# Patient Record
Sex: Female | Born: 1960 | Race: White | Hispanic: No | Marital: Married | Smoking: Former smoker
Health system: Southern US, Community
[De-identification: ages and names within clinical notes are randomized; demographics above are authoritative.]

## PROBLEM LIST (undated history)

## (undated) HISTORY — PX: ABDOMINAL HYSTERECTOMY: SHX81

---

## 2009-02-15 ENCOUNTER — Ambulatory Visit: Payer: Self-pay | Admitting: Occupational Medicine

## 2009-02-15 DIAGNOSIS — S93409A Sprain of unspecified ligament of unspecified ankle, initial encounter: Secondary | ICD-10-CM | POA: Insufficient documentation

## 2010-05-23 IMAGING — CR DG ANKLE COMPLETE 3+V*R*
3 series · 3 of 3 positions shown · non-contrast
Comparison: None.

CLINICAL DATA: Fall, right ankle pain

RIGHT ANKLE - COMPLETE 3+ VIEW

[view not recorded (1 of 3)]
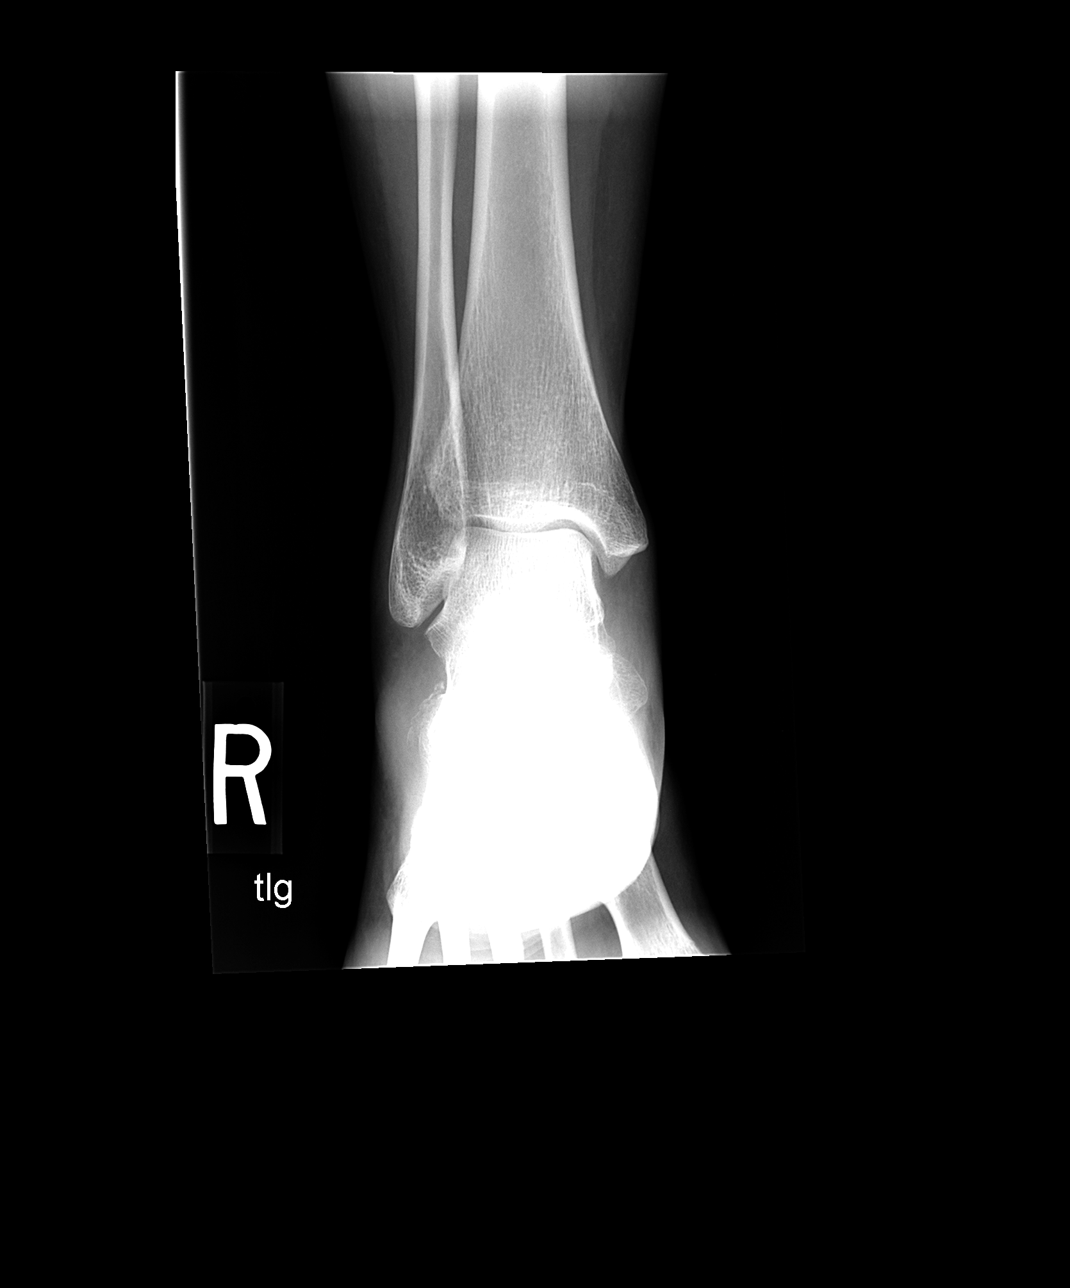

[view not recorded (2 of 3)]
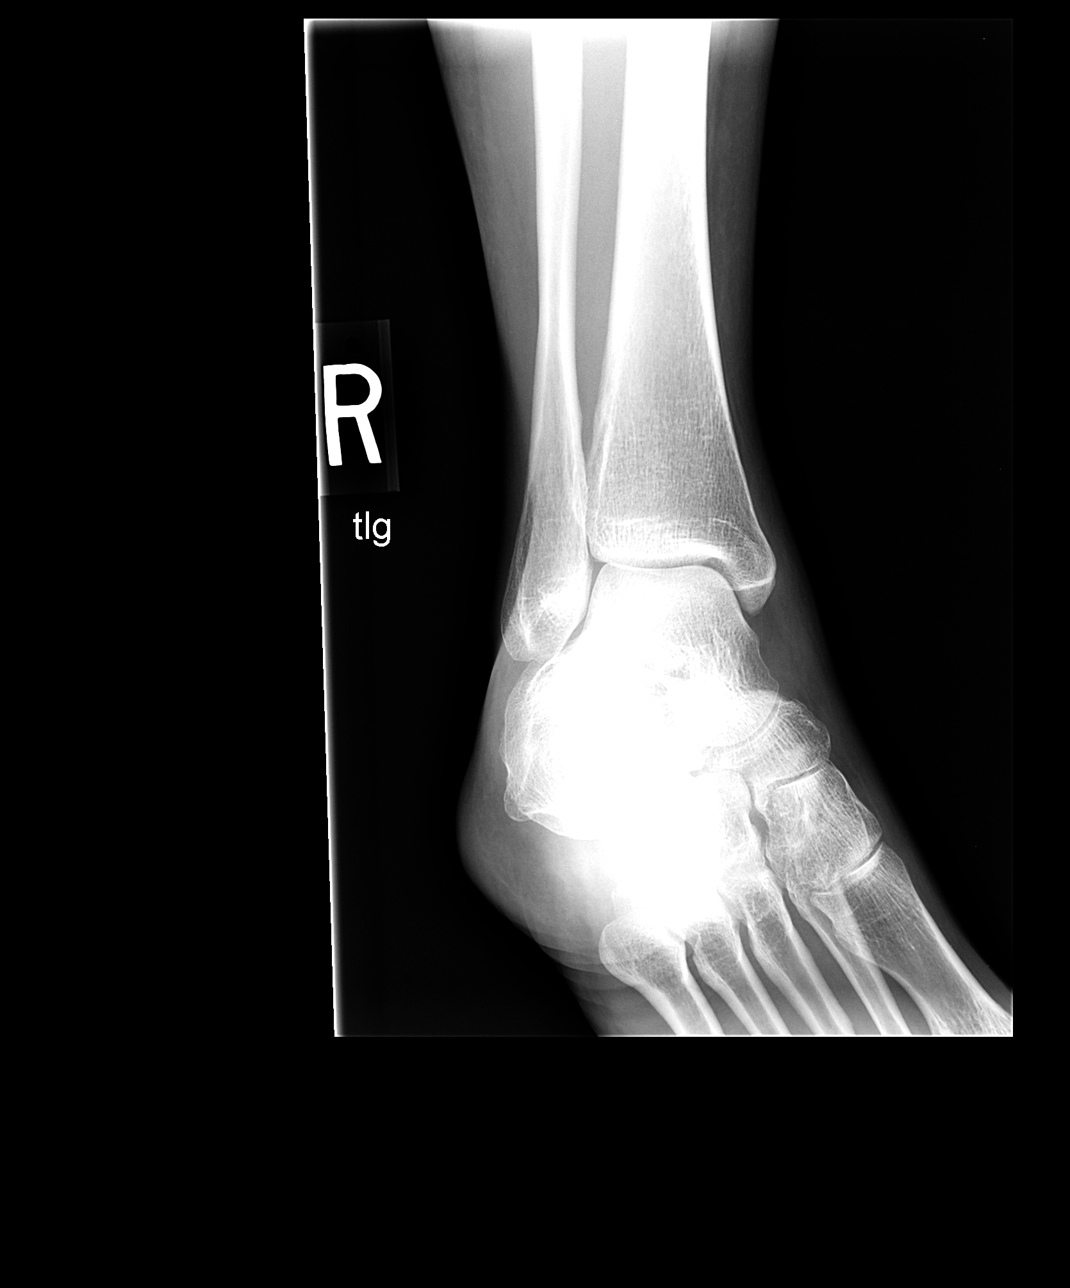

[view not recorded (3 of 3)]
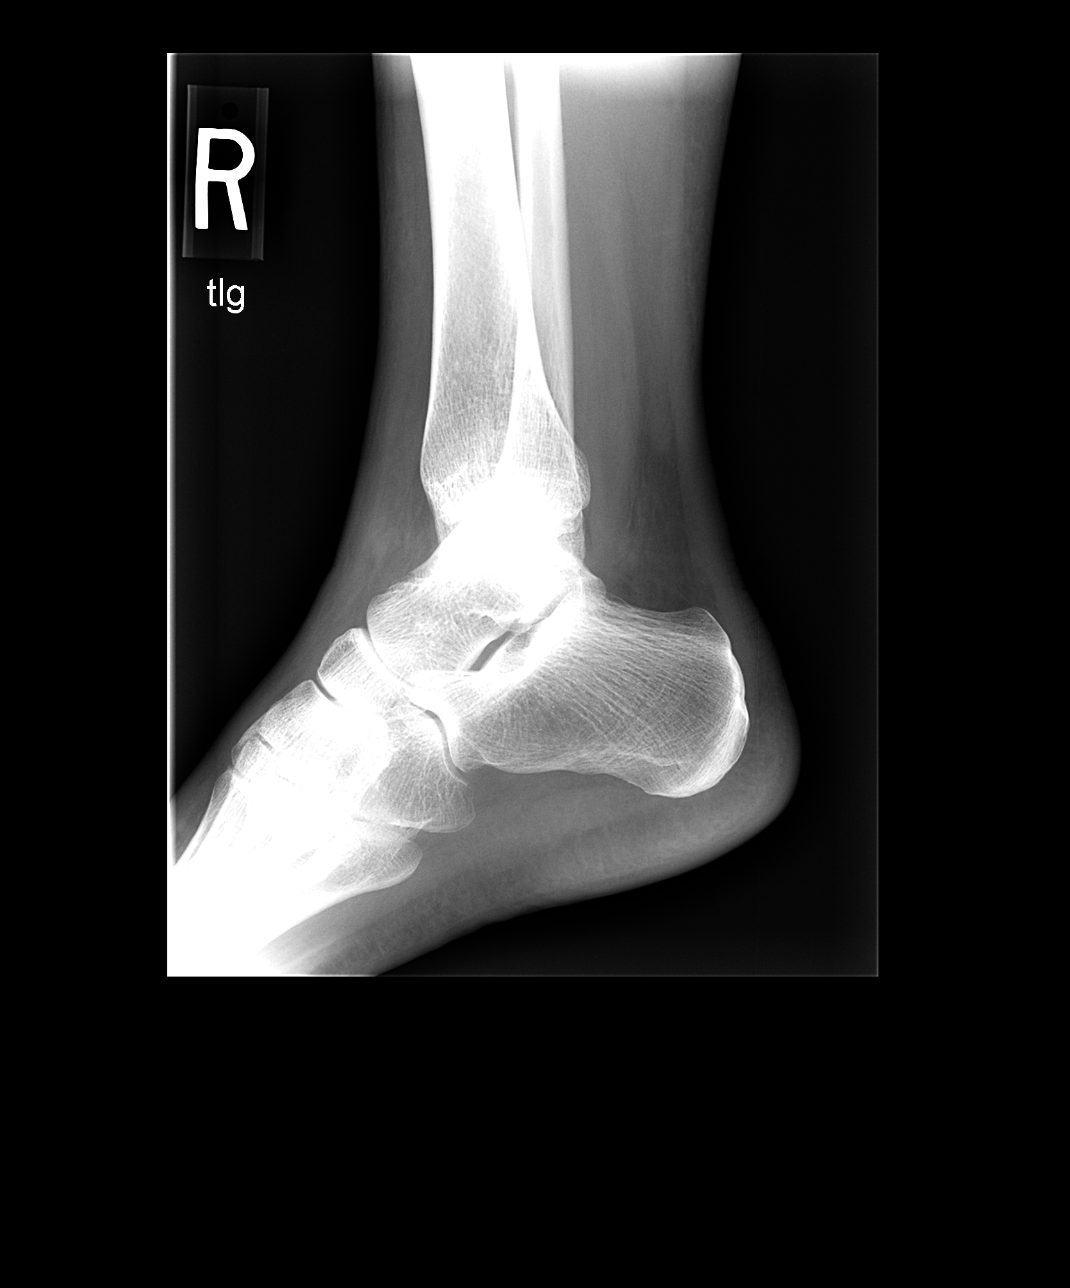

[3 of 3 positions shown; findings below may reference images not displayed]

FINDINGS: Normal alignment.  Mild soft tissue swelling laterally.
The frontal view demonstrates bony irregularity along the lateral
margin of the calcaneus and cuboid region.  A small avulsion
fracture in this region is not entirely excluded.  This is not
confirmed on the oblique and lateral views.  Recommend correlation
with clinical exam for focal point tenderness in this region.
Ankle alignment is anatomic.  Intact malleoli and talus.
IMPRESSION: Possible small avulsion fracture laterally along the calcaneus and
cuboid region as described.

## 2013-06-03 ENCOUNTER — Emergency Department (INDEPENDENT_AMBULATORY_CARE_PROVIDER_SITE_OTHER)
Admission: EM | Admit: 2013-06-03 | Discharge: 2013-06-03 | Disposition: A | Discharge status: Home / Self Care | Payer: BC Managed Care – PPO | Source: Home / Self Care | Attending: Family Medicine | Admitting: Family Medicine

## 2013-06-03 ENCOUNTER — Encounter: Payer: Self-pay | Admitting: *Deleted

## 2013-06-03 DIAGNOSIS — J069 Acute upper respiratory infection, unspecified: Secondary | ICD-10-CM

## 2013-06-03 DIAGNOSIS — J029 Acute pharyngitis, unspecified: Secondary | ICD-10-CM

## 2013-06-03 LAB — POCT RAPID STREP A (OFFICE): Rapid Strep A Screen: NEGATIVE

## 2013-06-03 MED ORDER — BENZONATATE 100 MG PO CAPS: ORAL_CAPSULE | ORAL | Status: DC

## 2013-06-03 MED ORDER — AZITHROMYCIN 250 MG PO TABS: ORAL_TABLET | ORAL | Status: DC

## 2013-06-03 NOTE — ED Provider Notes (Signed)
CSN: [id number]     Arrival date & time 06/03/13  1135 History   First MD Initiated Contact with Patient 06/03/13 1158     Chief Complaint  Patient presents with  . Sore Throat      HPI Comments: Patient developed a sore throat about 6 days ago that has persisted.  She remains fatigued and has now developed sinus congestion and post-nasal drainage.  She is also beginning to cough  The history is provided by the patient.    History reviewed. No pertinent past medical history. Past Surgical History  Procedure Laterality Date  . Abdominal hysterectomy      partial   Family History  Problem Relation Age of Onset  . Cancer Mother     breast   History  Substance Use Topics  . Smoking status: Former Smoker    Quit date: 06/03/1992  . Smokeless tobacco: Never Used  . Alcohol Use: Yes   OB History   Grav Para Term Preterm Abortions TAB SAB Ect Mult Living                 Review of Systems + sore throat + cough No pleuritic pain No wheezing + nasal congestion + post-nasal drainage No sinus pain/pressure No itchy/red eyes No earache No hemoptysis No SOB No fever, + chills No nausea No vomiting No abdominal pain No diarrhea No urinary symptoms No skin rashes + fatigue No myalgias No headache Used OTC meds without relief  Allergies  Penicillins  Home Medications   Current Outpatient Rx  Name  Route  Sig  Dispense  Refill  . ESTROGENS CONJ SYNTHETIC A PO   Oral   Take by mouth.         Marland Kitchen omeprazole (PRILOSEC) 10 MG capsule   Oral   Take 10 mg by mouth daily.         Marland Kitchen azithromycin (ZITHROMAX Z-PAK) 250 MG tablet      Take 2 tabs today; then begin one tab once daily for 4 more days. (Rx void after 06/11/13)   6 each   0   . benzonatate (TESSALON) 100 MG capsule      Take one cap at bedtime as necessary for cough   12 capsule   0    BP 132/86  Pulse 93  Temp(Src) 98.2 F (36.8 C) (Oral)  Resp 14  Ht 5\' 6"  (1.676 m)  Wt 145 lb (65.772 kg)   BMI 23.41 kg/m2  SpO2 99% Physical Exam Nursing notes and Vital Signs reviewed. Appearance:  Patient appears healthy, stated age, and in no acute distress Eyes:  Pupils are equal, round, and reactive to light and accomodation.  Extraocular movement is intact.  Conjunctivae are not inflamed  Ears:  Canals normal.  Tympanic membranes normal.  Nose:  Mildly congested turbinates.  No sinus tenderness.   Pharynx:   Minimal erythema Neck:  Supple.  Non- tender shotty anterior/posterior nodes are palpated bilaterally  Lungs:  Clear to auscultation.  Breath sounds are equal.  Heart:  Regular rate and rhythm without murmurs, rubs, or gallops.  Abdomen:  Nontender without masses or hepatosplenomegaly.  Bowel sounds are present.  No CVA or flank tenderness.  Extremities:  No edema.  No calf tenderness Skin:  No rash present.   ED Course  Procedures  none    Labs Reviewed  STREP A DNA PROBE  POCT RAPID STREP A (OFFICE) negative       MDM   1. Acute  pharyngitis   2. Acute upper respiratory infections of unspecified site; suspect viral URI    Throat culture pending. There is no evidence of bacterial infection today.  Treat symptomatically for now: Prescription written for Benzonatate [facility]) to take at bedtime for night-time cough.  Take plain Mucinex (guaifenesin) twice daily for cough and congestion.  May add Sudafed for sinus congestion.  Increase fluid intake, rest. May use Afrin nasal spray (or generic oxymetazoline) twice daily for about 5 days.  Also recommend using saline nasal spray several times daily and saline nasal irrigation (AYR is a common brand) Stop all antihistamines for now, and other non-prescription cough/cold preparations. May take Ibuprofen 200mg , 4 tabs every 8 hours with food for sore throat. Try warm salt water gargles. Begin Azithromycin if not improving about one week or if persistent fever develops (Given a prescription to hold, with an expiration date)   Follow-up with family doctor if not improving about10 days.     Lattie Haw, MD 06/03/13 1250

## 2013-06-03 NOTE — ED Notes (Signed)
Teran c/o sore throat, non-productve cough and body aches x 5 days. Taken Sudafed, Mucinex and Claritin otc.

## 2013-06-05 ENCOUNTER — Telehealth: Payer: Self-pay | Admitting: *Deleted

## 2013-10-30 ENCOUNTER — Emergency Department (INDEPENDENT_AMBULATORY_CARE_PROVIDER_SITE_OTHER)
Admission: EM | Admit: 2013-10-30 | Discharge: 2013-10-30 | Disposition: A | Discharge status: Home / Self Care | Payer: BC Managed Care – PPO | Source: Home / Self Care | Attending: Emergency Medicine | Admitting: Emergency Medicine

## 2013-10-30 ENCOUNTER — Encounter: Payer: Self-pay | Admitting: Emergency Medicine

## 2013-10-30 DIAGNOSIS — J0100 Acute maxillary sinusitis, unspecified: Secondary | ICD-10-CM

## 2013-10-30 MED ORDER — PROMETHAZINE-CODEINE 6.25-10 MG/5ML PO SYRP: ORAL_SOLUTION | ORAL | Status: AC

## 2013-10-30 MED ORDER — AZITHROMYCIN 250 MG PO TABS: ORAL_TABLET | ORAL | Status: AC

## 2013-10-30 NOTE — ED Notes (Signed)
Chloe Stewart c/o sinus pain, congestion, HA and cough with minimal sputum x 3 days. Denies fever. No flu vac this season.

## 2013-10-30 NOTE — ED Provider Notes (Signed)
CSN: [id number]     Arrival date & time 10/30/13  1029 History   First MD Initiated Contact with Patient 10/30/13 1031     Chief Complaint  Patient presents with  . Sinus Problem  . Cough   (Consider location/radiation/quality/duration/timing/severity/associated sxs/prior Treatment) HPI SINUSITIS  Onset: 3-4 days Facial/sinus pressure with discolored nasal mucus.    Severity: moderate Tried OTC meds without significant relief.  Symptoms:  + Fever  + URI prodrome with nasal congestion + Minimal swollen neck glands + mild Sinus Headache + mild ear pressure  No Allergy symptoms No significant Sore Throat No eye symptoms     No significant Cough No chest pain No shortness of breath  No wheezing  No Abdominal Pain No Nausea No Vomiting No diarrhea  No Myalgias No focal neurologic symptoms No syncope No Rash  No Urinary symptoms          History reviewed. No pertinent past medical history. Past Surgical History  Procedure Laterality Date  . Abdominal hysterectomy      partial   Family History  Problem Relation Age of Onset  . Cancer Mother     breast   History  Substance Use Topics  . Smoking status: Former Smoker    Quit date: 06/03/1992  . Smokeless tobacco: Never Used  . Alcohol Use: Yes   OB History   Grav Para Term Preterm Abortions TAB SAB Ect Mult Living                 Review of Systems  All other systems reviewed and are negative.    Allergies  Penicillins  Home Medications   Current Outpatient Rx  Name  Route  Sig  Dispense  Refill  . Mirabegron (MYRBETRIQ PO)   Oral   Take by mouth.         Marland Kitchen azithromycin (ZITHROMAX Z-PAK) 250 MG tablet      Take 2 tablets on day one, then 1 tablet daily on days 2 through 5   1 each   0   . ESTROGENS CONJ SYNTHETIC A PO   Oral   Take by mouth.         Marland Kitchen omeprazole (PRILOSEC) 10 MG capsule   Oral   Take 10 mg by mouth daily.         . promethazine-codeine (PHENERGAN WITH  CODEINE) 6.25-10 MG/5ML syrup      Take 1-2 teaspoons every 4-6 hours as needed for cough. May cause drowsiness.   120 mL   0    BP 120/81  Pulse 84  Temp(Src) 98 F (36.7 C) (Oral)  Resp 14  Wt 145 lb (65.772 kg)  SpO2 100% Physical Exam  Nursing note and vitals reviewed. Constitutional: She is oriented to person, place, and time. She appears well-developed and well-nourished. No distress.  HENT:  Head: Normocephalic and atraumatic.  Right Ear: Tympanic membrane, external ear and ear canal normal.  Left Ear: Tympanic membrane, external ear and ear canal normal.  Nose: Mucosal edema and rhinorrhea present. Right sinus exhibits maxillary sinus tenderness. Left sinus exhibits maxillary sinus tenderness.  Mouth/Throat: Oropharynx is clear and moist. No oral lesions. No oropharyngeal exudate.  Eyes: Right eye exhibits no discharge. Left eye exhibits no discharge. No scleral icterus.  Neck: Neck supple.  Cardiovascular: Normal rate, regular rhythm and normal heart sounds.   Pulmonary/Chest: Effort normal and breath sounds normal. She has no wheezes. She has no rales.  Lymphadenopathy:    She has  no cervical adenopathy.  Neurological: She is alert and oriented to person, place, and time.  Skin: Skin is warm and dry.    ED Course  Procedures (including critical care time) Labs Review Labs Reviewed - No data to display Imaging Review No results found.   MDM   1. Acute maxillary sinusitis    Treatment options discussed, as well as risks, benefits, alternatives. Patient voiced understanding and agreement with the following plans: Z-Pak Continue the Flonase that she has at home Mucinex D Promethazine with codeine, 1 or 2 teaspoons at bedtime if needed for severe cough Other symptomatic care discussed Follow-up with your primary care doctor in 5-7 days if not improving, or sooner if symptoms become worse. Precautions discussed. Red flags discussed. Questions invited and  answered. Patient voiced understanding and agreement.     Lajean Manesavid Massey, MD 10/30/13 1135
